# Patient Record
Sex: Male | Born: 1970
Health system: Southern US, Community
[De-identification: ages and names within clinical notes are randomized; demographics above are authoritative.]

## PROBLEM LIST (undated history)

## (undated) HISTORY — PX: NO PAST SURGERIES: SHX2092

---

## 2018-08-02 DIAGNOSIS — H524 Presbyopia: Secondary | ICD-10-CM | POA: Diagnosis not present

## 2020-01-02 ENCOUNTER — Other Ambulatory Visit: Payer: Self-pay

## 2020-01-02 ENCOUNTER — Encounter: Payer: Self-pay | Admitting: Cardiology

## 2020-01-02 ENCOUNTER — Ambulatory Visit (INDEPENDENT_AMBULATORY_CARE_PROVIDER_SITE_OTHER): Payer: BLUE CROSS/BLUE SHIELD | Admitting: Cardiology

## 2020-01-02 ENCOUNTER — Encounter (INDEPENDENT_AMBULATORY_CARE_PROVIDER_SITE_OTHER): Payer: Self-pay

## 2020-01-02 VITALS — BP 126/82 | HR 63 | Temp 97.6°F | Ht 71.0 in | Wt 207.6 lb

## 2020-01-02 DIAGNOSIS — Z1321 Encounter for screening for nutritional disorder: Secondary | ICD-10-CM

## 2020-01-02 DIAGNOSIS — Z1329 Encounter for screening for other suspected endocrine disorder: Secondary | ICD-10-CM | POA: Diagnosis not present

## 2020-01-02 DIAGNOSIS — Z8249 Family history of ischemic heart disease and other diseases of the circulatory system: Secondary | ICD-10-CM

## 2020-01-02 DIAGNOSIS — R011 Cardiac murmur, unspecified: Secondary | ICD-10-CM

## 2020-01-02 DIAGNOSIS — Z13220 Encounter for screening for lipoid disorders: Secondary | ICD-10-CM | POA: Diagnosis not present

## 2020-01-02 DIAGNOSIS — E7800 Pure hypercholesterolemia, unspecified: Secondary | ICD-10-CM

## 2020-01-02 NOTE — Progress Notes (Signed)
Cardiology Office Note:    Date:  01/02/2020   ID:  Jeff Curtis, DOB [birth year], MRN [id number]  PCP:  Paulina Fusi, MD  Cardiologist:  Garwin Brothers, MD   Referring MD: No ref. provider found    ASSESSMENT:    1. Cardiac murmur   2. Screening cholesterol level    PLAN:    In order of problems listed above:  1. Primary prevention stressed with the patient.  Importance of compliance with diet medication stressed and he vocalized understanding he will have complete blood including fasting lipids.  Importance of regular exercise stressed he was advised to walk at least half an hour a day at least 5 days a week. 2. Cardiac murmur: Echocardiogram will be done to assess this.  It appears to be a benign murmur. 3. Rate stratification: Patient gives history of coronary artery disease in family and therefore I discussed calcium scoring and is agreeable. 4. Further recommendations will be made based on the findings of the aforementioned test.Patient will be seen in follow-up appointment in 6 months or earlier if the patient has any concerns    Medication Adjustments/Labs and Tests Ordered: Current medicines are reviewed at length with the patient today.  Concerns regarding medicines are outlined above.  No orders of the defined types were placed in this encounter.  No orders of the defined types were placed in this encounter.    History of Present Illness:    Jeff Curtis is a 49 y.o. male who is being seen today for the evaluation of cardiac screening.  Patient has no significant past medical history.  He denies any history of hypertension dyslipidemia or diabetes mellitus.  He exercises on a aerobic fashion and regular basis.  With this he denies any symptoms.  He is here to be established for primary prevention.  At the time of my evaluation, the patient is alert awake oriented and in no distress. History reviewed. No pertinent past medical history.  Past Surgical  History:  Procedure Laterality Date  . NO PAST SURGERIES      Current Medications: No outpatient medications have been marked as taking for the 01/02/20 encounter (Office Visit) with Elder Davidian, Aundra Dubin, MD.     Allergies:   Penicillins   Social History   Socioeconomic History  . Marital status: Married    Spouse name: Not on file  . Number of children: Not on file  . Years of education: Not on file  . Highest education level: Not on file  Occupational History  . Not on file  Tobacco Use  . Smoking status: Never Smoker  . Smokeless tobacco: Never Used  Substance and Sexual Activity  . Alcohol use: Yes    Comment: social  . Drug use: Not on file  . Sexual activity: Not on file  Other Topics Concern  . Not on file  Social History Narrative  . Not on file   Social Determinants of Health   Financial Resource Strain:   . Difficulty of Paying Living Expenses:   Food Insecurity:   . Worried About Programme researcher, broadcasting/film/video in the Last Year:   . Barista in the Last Year:   Transportation Needs:   . Freight forwarder (Medical):   Marland Kitchen Lack of Transportation (Non-Medical):   Physical Activity:   . Days of Exercise per Week:   . Minutes of Exercise per Session:   Stress:   . Feeling of Stress :   Social  Connections:   . Frequency of Communication with Friends and Family:   . Frequency of Social Gatherings with Friends and Family:   . Attends Religious Services:   . Active Member of Clubs or Organizations:   . Attends Archivist Meetings:   Marland Kitchen Marital Status:      Family History: The patient's family history includes Heart attack in his maternal uncle; Heart disease in his maternal uncle and mother.  ROS:   Please see the history of present illness.    All other systems reviewed and are negative.  EKGs/Labs/Other Studies Reviewed:    The following studies were reviewed today: EKG reveals sinus rhythm and nonspecific ST-T changes   Recent Labs: No  results found for requested labs within last 8760 hours.  Recent Lipid Panel No results found for: CHOL, TRIG, HDL, CHOLHDL, VLDL, LDLCALC, LDLDIRECT  Physical Exam:    VS:  BP 126/82   Pulse 63   Temp 97.6 F (36.4 C)   Ht 5\' 11"  (1.803 m)   Wt 207 lb 9.6 oz (94.2 kg)   SpO2 98%   BMI 28.95 kg/m     Wt Readings from Last 3 Encounters:  01/02/20 207 lb 9.6 oz (94.2 kg)     GEN: Patient is in no acute distress HEENT: Normal NECK: No JVD; No carotid bruits LYMPHATICS: No lymphadenopathy CARDIAC: S1 S2 regular, 2/6 systolic murmur at the apex. RESPIRATORY:  Clear to auscultation without rales, wheezing or rhonchi  ABDOMEN: Soft, non-tender, non-distended MUSCULOSKELETAL:  No edema; No deformity  SKIN: Warm and dry NEUROLOGIC:  Alert and oriented x 3 PSYCHIATRIC:  Normal affect    Signed, Jenean Lindau, MD  01/02/2020 10:26 AM    [facility]

## 2020-01-02 NOTE — Patient Instructions (Signed)
Medication Instructions:  No medication changes *If you need a refill on your cardiac medications before your next appointment, please call your pharmacy*   Lab Work: Your physician recommends that you have labs done today in the office. You had a  BMET, CBC, TSH, LFT's, Lipids and Vitamin D.  If you have labs (blood work) drawn today and your tests are completely normal, you will receive your results only by: Marland Kitchen MyChart Message (if you have MyChart) OR . A paper copy in the mail If you have any lab test that is abnormal or we need to change your treatment, we will call you to review the results.   Testing/Procedures: Your physician has requested that you have an echocardiogram. Echocardiography is a painless test that uses sound waves to create images of your heart. It provides your doctor with information about the size and shape of your heart and how well your heart's chambers and valves are working. This procedure takes approximately one hour. There are no restrictions for this procedure.   We will order CT coronary calcium score $150  Please call [phone] to schedule   [facility]  [address]  [place], [place] [address]    Follow-Up: At [facility], you and your health needs are our priority.  As part of our continuing mission to provide you with exceptional heart care, we have created designated Provider Care Teams.  These Care Teams include your primary Cardiologist (physician) and Advanced Practice Providers (APPs -  Physician Assistants and Nurse Practitioners) who all work together to provide you with the care you need, when you need it.  We recommend signing up for the patient portal called "MyChart".  Sign up information is provided on this After Visit Summary.  MyChart is used to connect with patients for Virtual Visits (Telemedicine).  Patients are able to view lab/test results, encounter notes, upcoming appointments, etc.  Non-urgent messages can be  sent to your provider as well.   To learn more about what you can do with MyChart, go to [email or web address].    Your next appointment:   6 month(s)  The format for your next appointment:   In Person  Provider:   Belva Crome, MD   Other Instructions  Echocardiogram An echocardiogram is a procedure that uses painless sound waves (ultrasound) to produce an image of the heart. Images from an echocardiogram can provide important information about:  Signs of coronary artery disease (CAD).  Aneurysm detection. An aneurysm is a weak or damaged part of an artery wall that bulges out from the normal force of blood pumping through the body.  Heart size and shape. Changes in the size or shape of the heart can be associated with certain conditions, including heart failure, aneurysm, and CAD.  Heart muscle function.  Heart valve function.  Signs of a past heart attack.  Fluid buildup around the heart.  Thickening of the heart muscle.  A tumor or infectious growth around the heart valves. Tell a health care provider about:  Any allergies you have.  All medicines you are taking, including vitamins, herbs, eye drops, creams, and over-the-counter medicines.  Any blood disorders you have.  Any surgeries you have had.  Any medical conditions you have.  Whether you are pregnant or may be pregnant. What are the risks? Generally, this is a safe procedure. However, problems may occur, including:  Allergic reaction to dye (contrast) that may be used during the procedure. What happens before the procedure?  No specific preparation is needed. You may eat and drink normally. What happens during the procedure?   An IV tube may be inserted into one of your veins.  You may receive contrast through this tube. A contrast is an injection that improves the quality of the pictures from your heart.  A gel will be applied to your chest.  A wand-like tool (transducer) will be moved  over your chest. The gel will help to transmit the sound waves from the transducer.  The sound waves will harmlessly bounce off of your heart to allow the heart images to be captured in real-time motion. The images will be recorded on a computer. The procedure may vary among health care providers and hospitals. What happens after the procedure?  You may return to your normal, everyday life, including diet, activities, and medicines, unless your health care provider tells you not to do that. Summary  An echocardiogram is a procedure that uses painless sound waves (ultrasound) to produce an image of the heart.  Images from an echocardiogram can provide important information about the size and shape of your heart, heart muscle function, heart valve function, and fluid buildup around your heart.  You do not need to do anything to prepare before this procedure. You may eat and drink normally.  After the echocardiogram is completed, you may return to your normal, everyday life, unless your health care provider tells you not to do that. This information is not intended to replace advice given to you by your health care provider. Make sure you discuss any questions you have with your health care provider. Document Revised: 12/26/2018 Document Reviewed: 10/07/2016 Elsevier Patient Education  [place].

## 2020-01-03 LAB — BASIC METABOLIC PANEL
BUN/Creatinine Ratio: 13 (ref 9–20)
BUN: 11 mg/dL (ref 6–24)
CO2: 22 mmol/L (ref 20–29)
Calcium: 9.4 mg/dL (ref 8.7–10.2)
Chloride: 101 mmol/L (ref 96–106)
Creatinine, Ser: 0.88 mg/dL (ref 0.76–1.27)
GFR calc Af Amer: 118 mL/min/{1.73_m2} (ref >59)
GFR calc non Af Amer: 102 mL/min/{1.73_m2} (ref >59)
Glucose: 90 mg/dL (ref 65–99)
Potassium: 4.5 mmol/L (ref 3.5–5.2)
Sodium: 139 mmol/L (ref 134–144)

## 2020-01-03 LAB — CBC WITH DIFFERENTIAL/PLATELET
Basophils Absolute: 0 10*3/uL (ref 0.0–0.2)
Basos: 1 %
EOS (ABSOLUTE): 0.1 10*3/uL (ref 0.0–0.4)
Eos: 1 %
Hematocrit: 41.5 % (ref 37.5–51.0)
Hemoglobin: 14.7 g/dL (ref 13.0–17.7)
Immature Grans (Abs): 0 10*3/uL (ref 0.0–0.1)
Immature Granulocytes: 0 %
Lymphocytes Absolute: 1.6 10*3/uL (ref 0.7–3.1)
Lymphs: 30 %
MCH: 32 pg (ref 26.6–33.0)
MCHC: 35.4 g/dL (ref 31.5–35.7)
MCV: 90 fL (ref 79–97)
Monocytes Absolute: 0.6 10*3/uL (ref 0.1–0.9)
Monocytes: 11 %
Neutrophils Absolute: 3.1 10*3/uL (ref 1.4–7.0)
Neutrophils: 57 %
Platelets: 230 10*3/uL (ref 150–450)
RBC: 4.6 x10E6/uL (ref 4.14–5.80)
RDW: 12 % (ref 11.6–15.4)
WBC: 5.3 10*3/uL (ref 3.4–10.8)

## 2020-01-03 LAB — HEPATIC FUNCTION PANEL
ALT: 16 IU/L (ref 0–44)
AST: 25 IU/L (ref 0–40)
Albumin: 4.8 g/dL (ref 4.0–5.0)
Alkaline Phosphatase: 90 IU/L (ref 39–117)
Bilirubin Total: 0.7 mg/dL (ref 0.0–1.2)
Bilirubin, Direct: 0.16 mg/dL (ref 0.00–0.40)
Total Protein: 7.2 g/dL (ref 6.0–8.5)

## 2020-01-03 LAB — LIPID PANEL
Chol/HDL Ratio: 5 ratio (ref 0.0–5.0)
Cholesterol, Total: 213 mg/dL — ABNORMAL HIGH (ref 100–199)
HDL: 43 mg/dL (ref >39)
LDL Chol Calc (NIH): 155 mg/dL — ABNORMAL HIGH (ref 0–99)
Triglycerides: 82 mg/dL (ref 0–149)
VLDL Cholesterol Cal: 15 mg/dL (ref 5–40)

## 2020-01-03 LAB — VITAMIN D 25 HYDROXY (VIT D DEFICIENCY, FRACTURES): Vit D, 25-Hydroxy: 55.2 ng/mL (ref 30.0–100.0)

## 2020-01-03 LAB — TSH: TSH: 1.28 u[IU]/mL (ref 0.450–4.500)

## 2020-01-08 ENCOUNTER — Other Ambulatory Visit: Payer: Self-pay

## 2020-01-08 DIAGNOSIS — E7800 Pure hypercholesterolemia, unspecified: Secondary | ICD-10-CM

## 2020-01-08 NOTE — Addendum Note (Signed)
Addended by: Eleonore Chiquito on: 01/08/2020 03:04 PM   Modules accepted: Orders

## 2020-01-15 ENCOUNTER — Other Ambulatory Visit: Payer: Self-pay

## 2020-01-15 ENCOUNTER — Ambulatory Visit (INDEPENDENT_AMBULATORY_CARE_PROVIDER_SITE_OTHER)
Admission: RE | Admit: 2020-01-15 | Discharge: 2020-01-15 | Disposition: A | Discharge status: Home / Self Care | Payer: Self-pay | Source: Ambulatory Visit | Attending: Cardiology | Admitting: Cardiology

## 2020-01-15 DIAGNOSIS — R011 Cardiac murmur, unspecified: Secondary | ICD-10-CM

## 2020-01-15 DIAGNOSIS — Z8249 Family history of ischemic heart disease and other diseases of the circulatory system: Secondary | ICD-10-CM

## 2020-01-23 ENCOUNTER — Ambulatory Visit (INDEPENDENT_AMBULATORY_CARE_PROVIDER_SITE_OTHER): Payer: BLUE CROSS/BLUE SHIELD

## 2020-01-23 ENCOUNTER — Other Ambulatory Visit: Payer: Self-pay

## 2020-01-23 DIAGNOSIS — R011 Cardiac murmur, unspecified: Secondary | ICD-10-CM | POA: Diagnosis not present

## 2020-01-23 NOTE — Progress Notes (Signed)
Complete echocardiogram performed.  Jimmy Nusaiba Guallpa RDCS, RVT  

## 2020-04-20 DIAGNOSIS — D2271 Melanocytic nevi of right lower limb, including hip: Secondary | ICD-10-CM | POA: Diagnosis not present

## 2020-04-20 DIAGNOSIS — D225 Melanocytic nevi of trunk: Secondary | ICD-10-CM | POA: Diagnosis not present

## 2020-04-20 DIAGNOSIS — D2261 Melanocytic nevi of right upper limb, including shoulder: Secondary | ICD-10-CM | POA: Diagnosis not present

## 2020-04-20 DIAGNOSIS — D2272 Melanocytic nevi of left lower limb, including hip: Secondary | ICD-10-CM | POA: Diagnosis not present

## 2020-06-22 IMAGING — CT CT CARDIAC CORONARY ARTERY CALCIUM SCORE
3 series · 14 of 20 positions shown, 15 images · non-contrast
Comparison: None.
COMPARISON: None.

Addendum:
EXAM:
OVER-READ INTERPRETATION  CT CHEST

The following report is an over-read performed by radiologist Dr.
Rinnah Tiger [REDACTED] on 01/15/2020. This over-read
does not include interpretation of cardiac or coronary anatomy or
pathology. The calcium score interpretation by the cardiologist is
attached.
CLINICAL DATA: Risk stratification
Coronary Calcium Score
TECHNIQUE: The patient was scanned on a Siemens Force scanner. Axial
non-contrast 3 mm slices were carried out through the heart. The
data set was analyzed on a dedicated work station and scored using
the Agatson method.

[Series 2: casc 3.0 bv41 2 bestdiast 67 % · axial · 0.36mm/px · z∈[-229,-163]mm · 4 of 38 slices shown, 5 images]
[im 8/38  vessel]
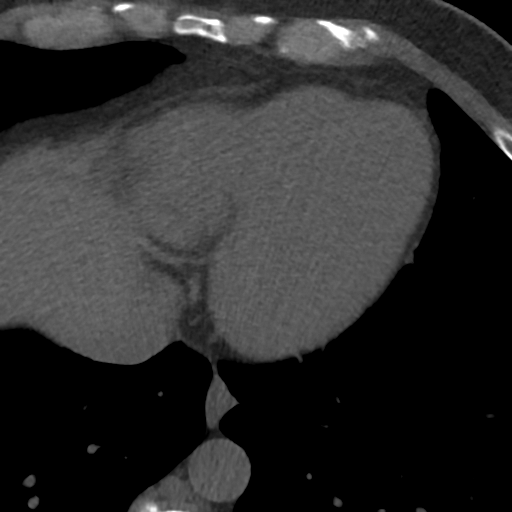
[im 8/38  lung]
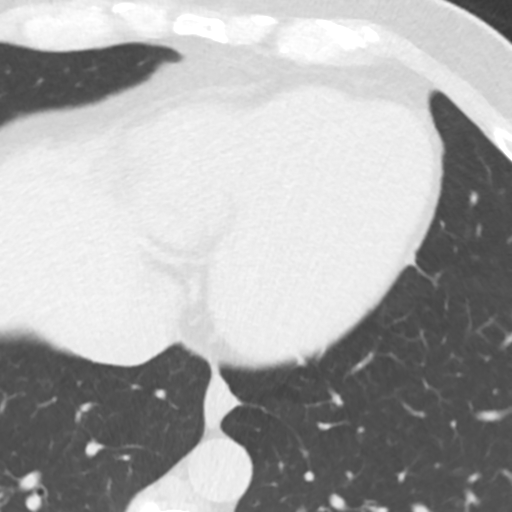
[im 15/38  vessel]
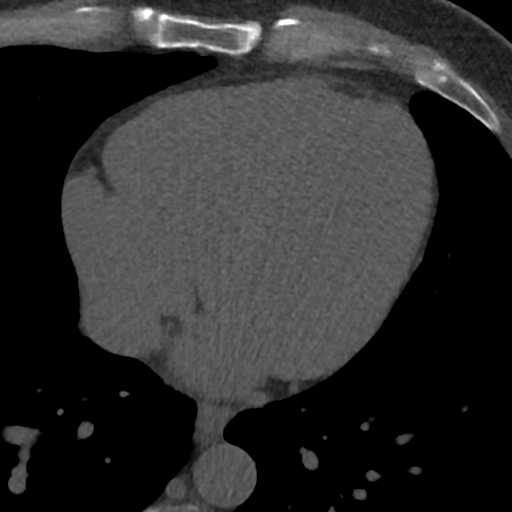
[im 23/38  vessel]
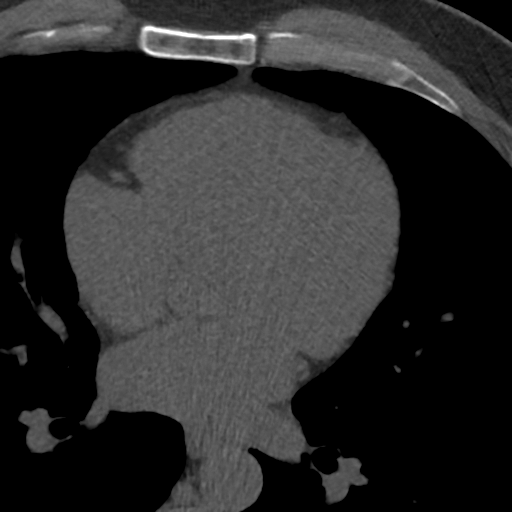
[im 30/38  vessel]
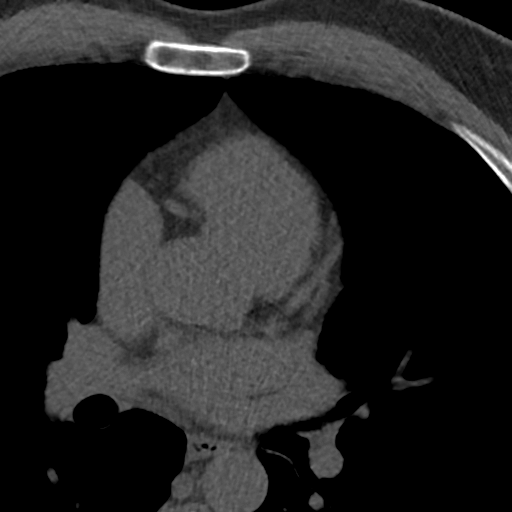

[Series 3: lung 67 % · axial · 0.72mm/px · z∈[-232,-160]mm · 5 of 38 slices shown]
[im 7/38  lung]
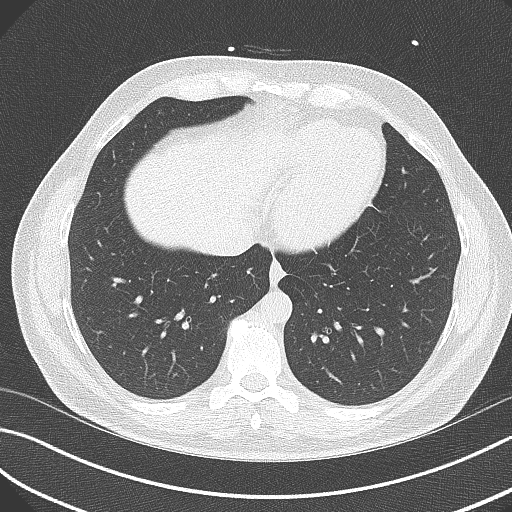
[im 13/38  lung]
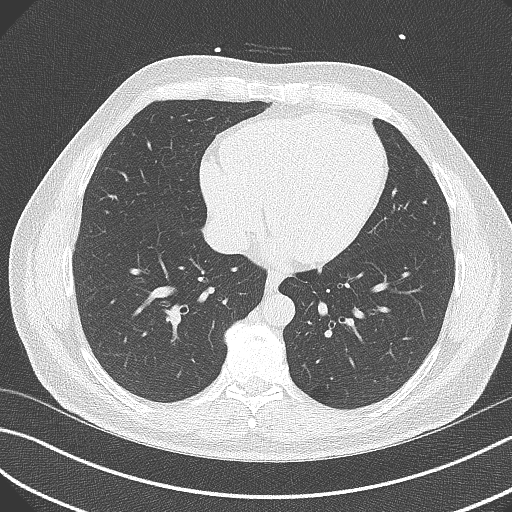
[im 19/38  lung]
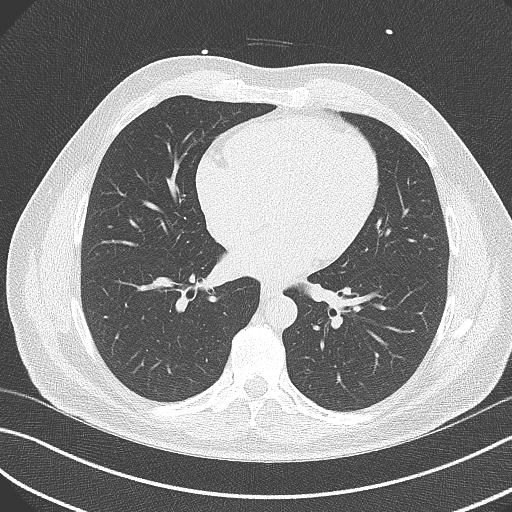
[im 25/38  lung]
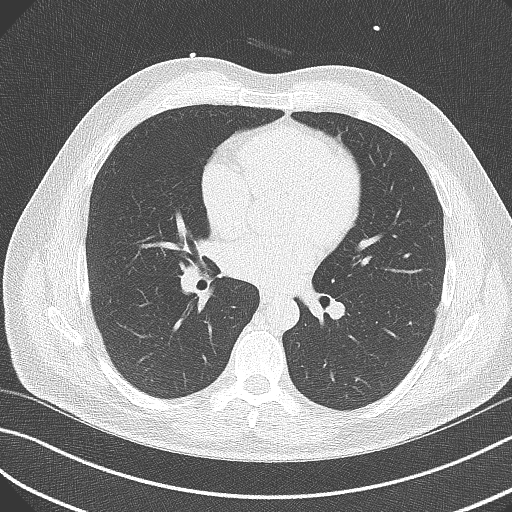
[im 31/38  lung]
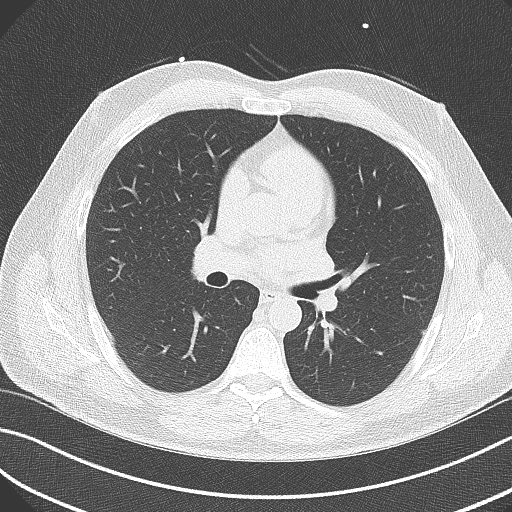

[Series 4: lung st 67 % · axial · 0.72mm/px · z∈[-232,-160]mm · 5 of 38 slices shown]
[im 7/38  lung]
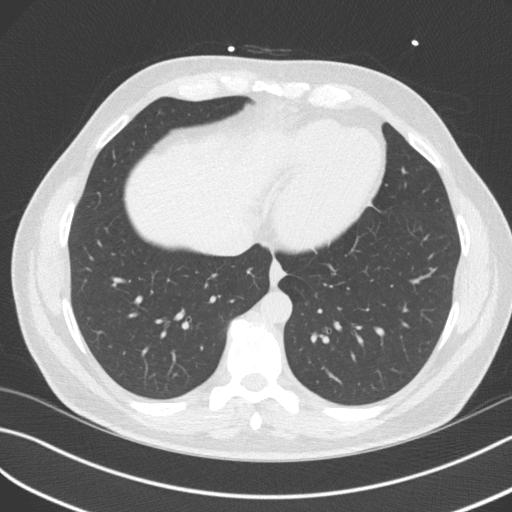
[im 13/38  lung]
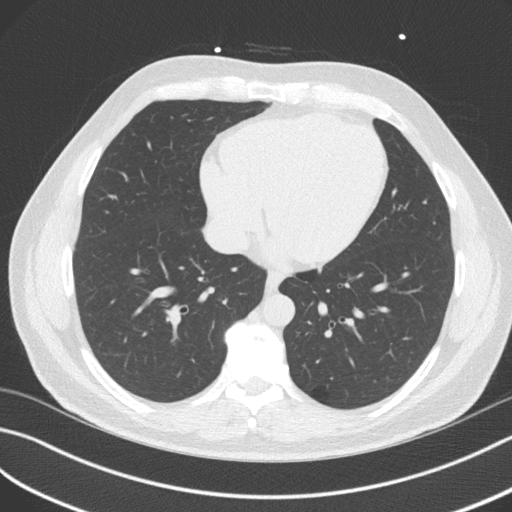
[im 19/38  lung]
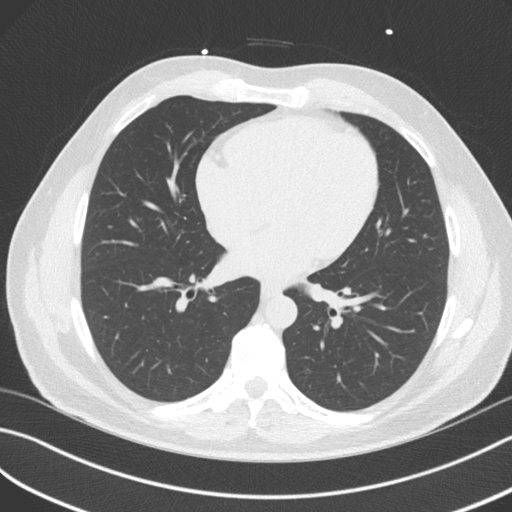
[im 25/38  lung]
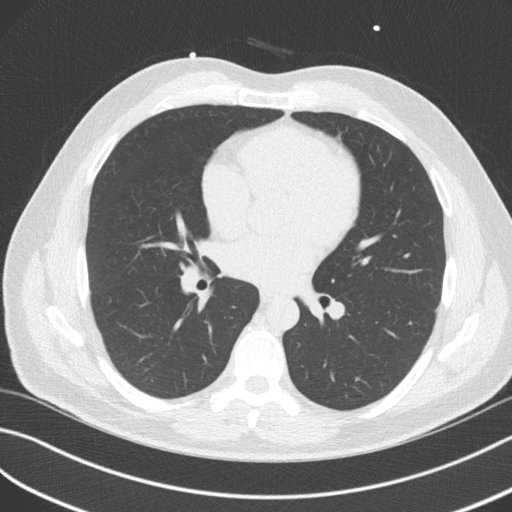
[im 31/38  lung]
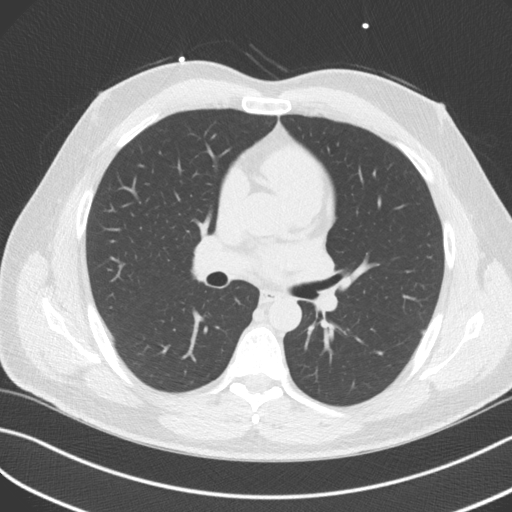

[14 of 20 positions shown; findings below may reference images not displayed]

FINDINGS: Vascular: Normal aortic caliber.

Mediastinum/Nodes: No imaged thoracic adenopathy.

Lungs/Pleura: No pleural fluid.  Clear imaged lungs.

Upper Abdomen: Normal imaged portions of the liver, stomach.

Musculoskeletal: No acute osseous abnormality.
IMPRESSION: No acute findings in the imaged extracardiac chest.
FINDINGS: Non-cardiac: See separate report from [REDACTED].

Ascending Aorta: Normal size

Pericardium: Normal

Coronary arteries: The origin of the RCA is anterior - the ostium
and the part of the proximal portion appears to be between the
Pulmonary artery and the aorta. The left main originates normally
from the left cusp.
IMPRESSION: Coronary calcium score of 0. This was 0 percentile for age and sex
matched control.

Nesib Beg,DO

*** End of Addendum ***
EXAM:
OVER-READ INTERPRETATION  CT CHEST

The following report is an over-read performed by radiologist Dr.
Rinnah Tiger [REDACTED] on 01/15/2020. This over-read
does not include interpretation of cardiac or coronary anatomy or
pathology. The calcium score interpretation by the cardiologist is
attached.
FINDINGS: Vascular: Normal aortic caliber.

Mediastinum/Nodes: No imaged thoracic adenopathy.

Lungs/Pleura: No pleural fluid.  Clear imaged lungs.

Upper Abdomen: Normal imaged portions of the liver, stomach.

Musculoskeletal: No acute osseous abnormality.
IMPRESSION: No acute findings in the imaged extracardiac chest.

## 2020-08-24 DIAGNOSIS — H5202 Hypermetropia, left eye: Secondary | ICD-10-CM | POA: Diagnosis not present

## 2021-08-15 DIAGNOSIS — H524 Presbyopia: Secondary | ICD-10-CM | POA: Diagnosis not present
